# Patient Record
Sex: Female | Born: 1965 | Hispanic: Yes | Marital: Married | Smoking: Never smoker
Health system: Southern US, Community
[De-identification: ages and names within clinical notes are randomized; demographics above are authoritative.]

---

## 2016-11-18 ENCOUNTER — Ambulatory Visit: Payer: Self-pay

## 2016-11-18 ENCOUNTER — Ambulatory Visit
Admission: RE | Admit: 2016-11-18 | Discharge: 2016-11-18 | Disposition: A | Discharge status: Home / Self Care | Payer: Self-pay | Source: Ambulatory Visit | Attending: Oncology | Admitting: Oncology

## 2016-11-18 ENCOUNTER — Encounter (INDEPENDENT_AMBULATORY_CARE_PROVIDER_SITE_OTHER): Payer: Self-pay

## 2016-11-18 ENCOUNTER — Encounter: Payer: Self-pay | Admitting: *Deleted

## 2016-11-18 ENCOUNTER — Ambulatory Visit: Payer: Self-pay | Attending: Oncology | Admitting: *Deleted

## 2016-11-18 VITALS — BP 105/70 | HR 73 | Temp 92.7°F | Resp 18 | Ht 62.0 in | Wt 148.4 lb

## 2016-11-18 DIAGNOSIS — Z0000 Encounter for general adult medical examination without abnormal findings: Secondary | ICD-10-CM

## 2016-11-18 NOTE — Patient Instructions (Signed)
Gave patient hand-out, Women Staying Healthy, Active and Well from [facility], with education on breast health, pap smears, heart and colon health. 

## 2016-11-18 NOTE — Progress Notes (Signed)
Subjective:     Patient ID: Flavia ShipperAntonia Pantaleon Santos, female   DOB: February 13, 1966, 51 y.o.   MRN: [id number]  HPI   Review of Systems     Objective:   Physical Exam  Pulmonary/Chest: Right breast exhibits no inverted nipple, no mass, no nipple discharge, no skin change and no tenderness. Left breast exhibits no inverted nipple, no mass, no nipple discharge, no skin change and no tenderness. Breasts are symmetrical.       Assessment:     51 year old Hispanic [facility] and [facility].  I used the Language line interpreter [phone] for interpretation.  Clinical breast exam unremarkable.  Taught self breast awareness.  Patient's last pap was in 2015.  [facility].  Pap was negative, but HPV was not tested.  Patient is due for next pap, but she prefers to [facility] that [facility].  Informed her son how [facility].  Patient has been screened for eligibility.  She does not have any insurance, Medicare or Medicaid.  She also meets financial eligibility.  Hand-out given on the Affordable Care Act.    Plan:     Baseline screening mammogram ordered.  Will follow-up per [facility] protocol.

## 2016-11-19 ENCOUNTER — Encounter: Payer: Self-pay | Admitting: *Deleted

## 2016-11-19 DIAGNOSIS — N6489 Other specified disorders of breast: Secondary | ICD-10-CM

## 2016-11-27 ENCOUNTER — Ambulatory Visit
Admission: RE | Admit: 2016-11-27 | Discharge: 2016-11-27 | Disposition: A | Discharge status: Home / Self Care | Payer: Self-pay | Source: Ambulatory Visit | Attending: Oncology | Admitting: Oncology

## 2016-11-27 ENCOUNTER — Encounter: Payer: Self-pay | Admitting: *Deleted

## 2016-11-27 DIAGNOSIS — N6489 Other specified disorders of breast: Secondary | ICD-10-CM

## 2016-11-27 NOTE — Progress Notes (Signed)
Letter mailed to inform patient of her normal mammogram.  She is to return in one year for her annual screening.  HSIS to [facility]y. 

## 2016-12-04 ENCOUNTER — Encounter: Payer: Self-pay | Admitting: Family Medicine

## 2018-06-01 IMAGING — MG MM DIGITAL SCREENING BILAT W/ TOMO W/ CAD
9 of 13 series · 9 of 29 positions shown · non-contrast
Comparison: None.

CLINICAL DATA: Screening. Baseline exam.

EXAM:
2D DIGITAL SCREENING BILATERAL MAMMOGRAM WITH CAD AND ADJUNCT TOMO

[L CC]
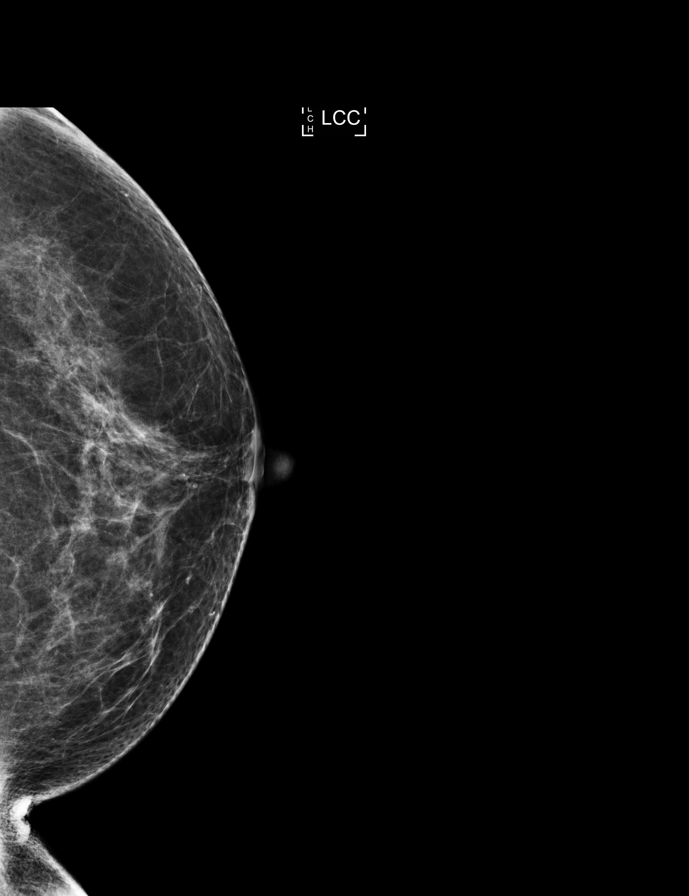

[L MLO synth-2D]
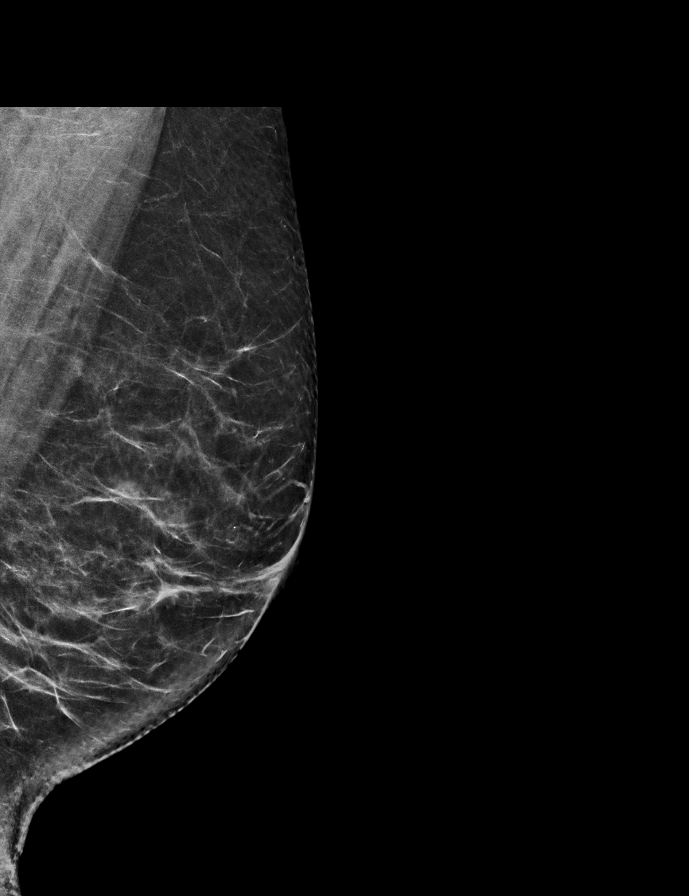

[R CC synth-2D]
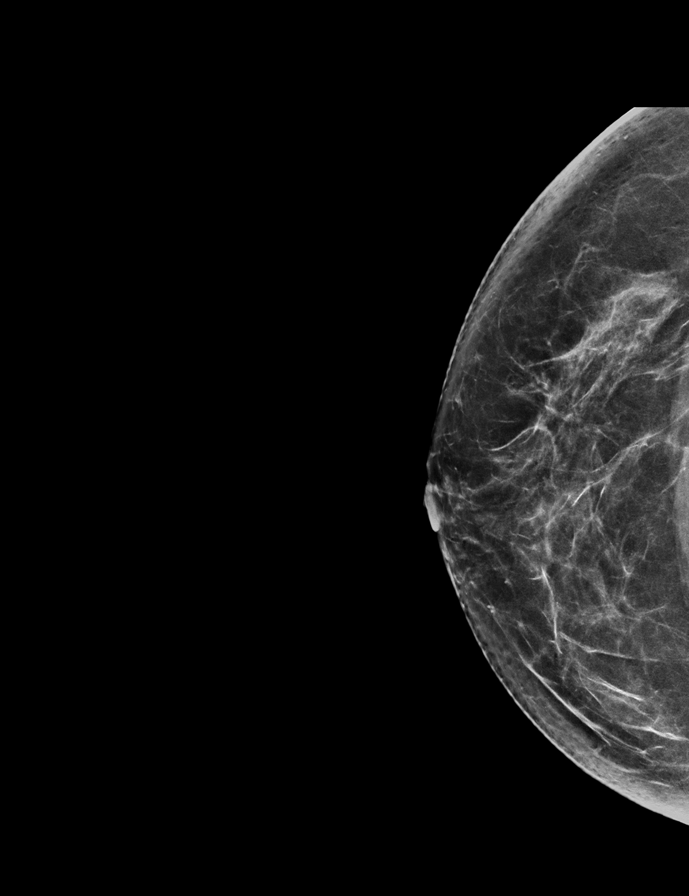

[R MLO]
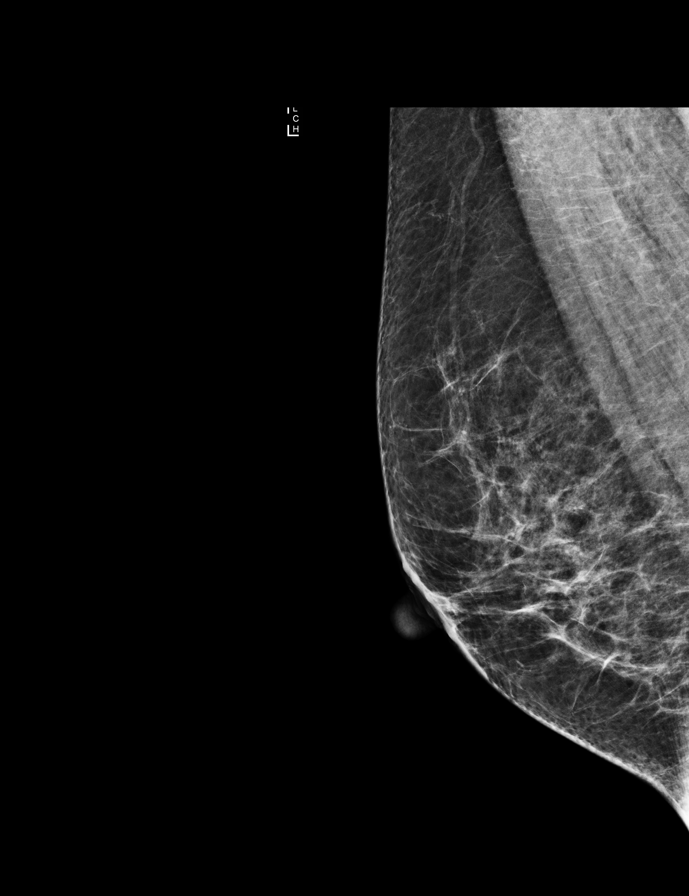

[R MLO synth-2D]
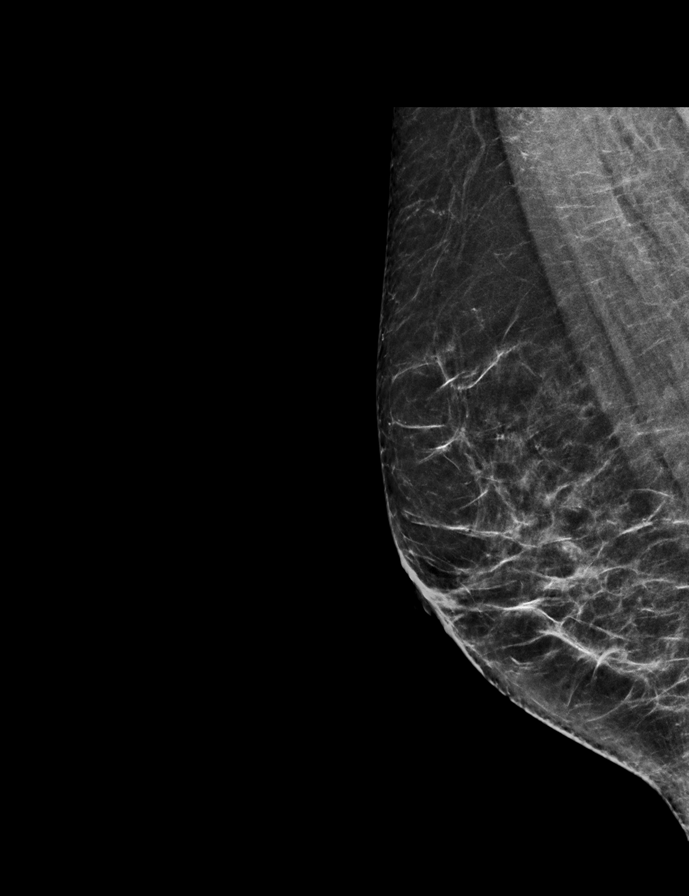

[L CC synth-2D]
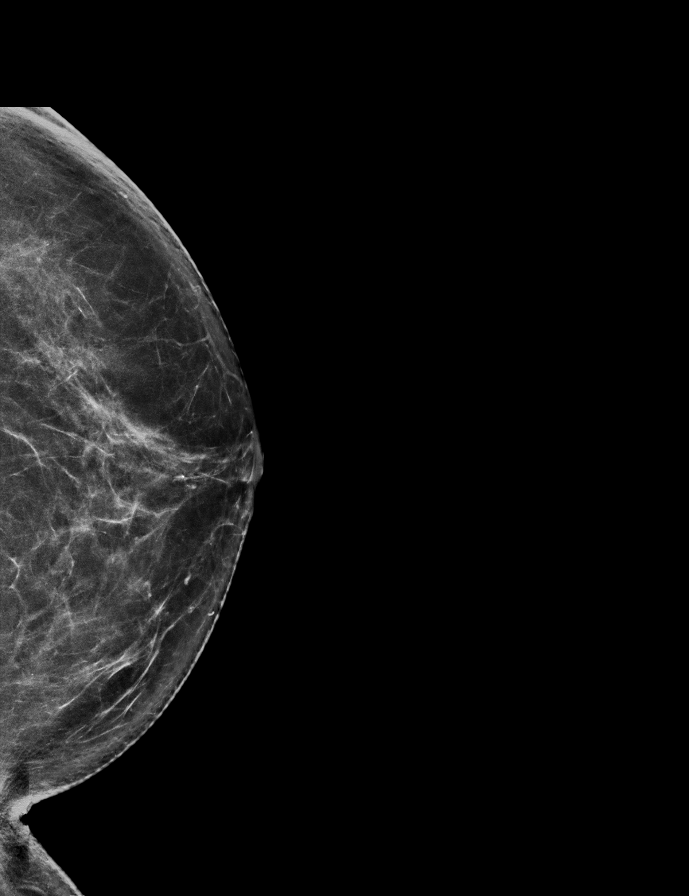

[L MLO (1 of 2)]
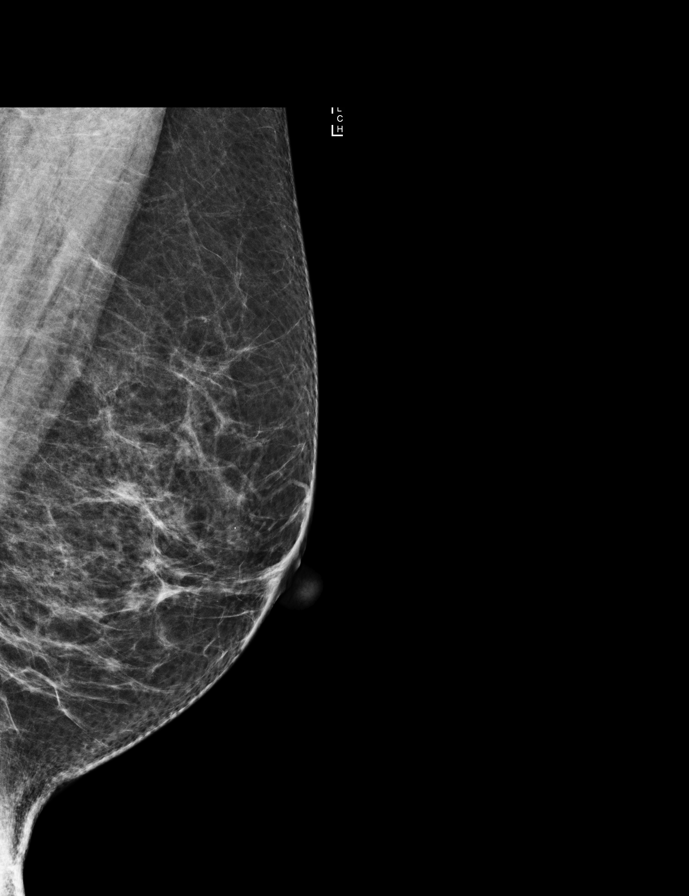

[R CC]
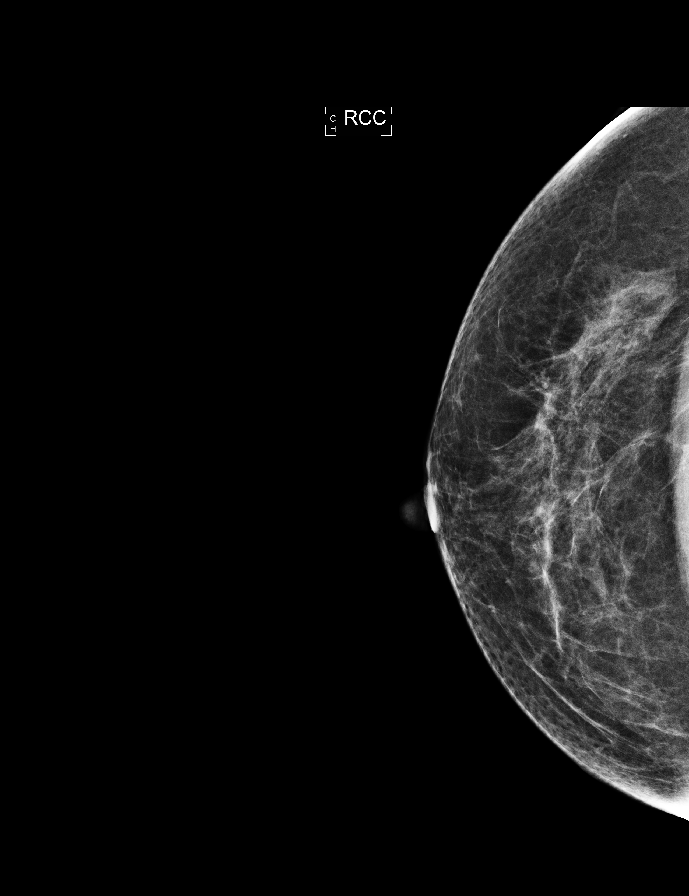

[L MLO (2 of 2)]
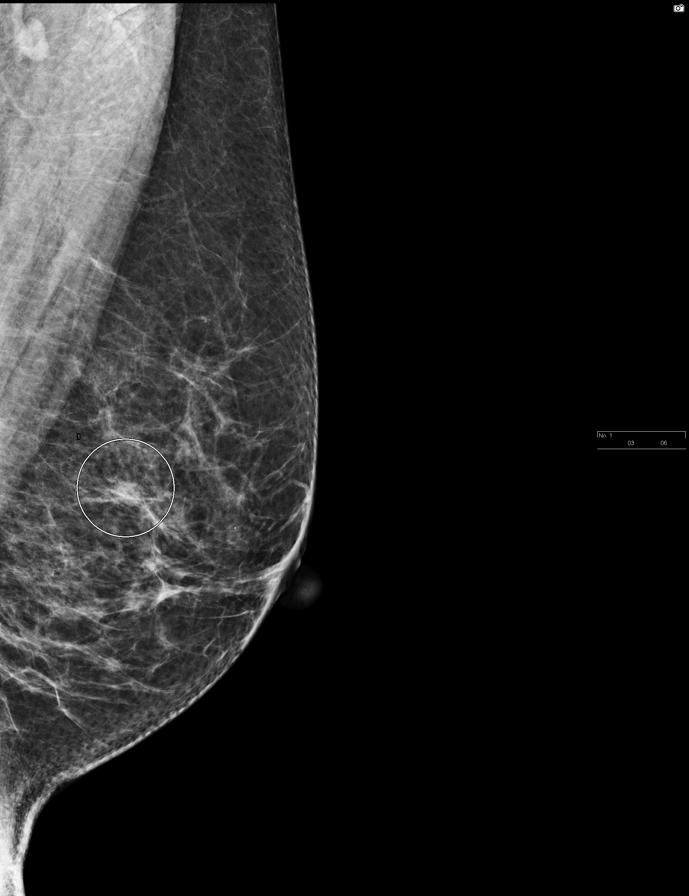

[9 of 29 positions shown; findings below may reference images not displayed]

ACR Breast Density Category b: There are scattered areas of
fibroglandular density.
FINDINGS: In the left breast, a possible asymmetry warrants further
evaluation. In the right breast, no findings suspicious for
malignancy. Images were processed with CAD.
IMPRESSION: Further evaluation is suggested for possible asymmetry in the left
breast.

RECOMMENDATION:
Diagnostic mammogram and possibly ultrasound of the left breast.
(Code:[id number])

The patient will be contacted regarding the findings, and additional
imaging will be scheduled.

BI-RADS CATEGORY  0: Incomplete. Need additional imaging evaluation
and/or prior mammograms for comparison.

## 2022-01-07 ENCOUNTER — Other Ambulatory Visit: Payer: Self-pay

## 2022-01-07 DIAGNOSIS — Z1211 Encounter for screening for malignant neoplasm of colon: Secondary | ICD-10-CM

## 2022-01-07 DIAGNOSIS — Z1231 Encounter for screening mammogram for malignant neoplasm of breast: Secondary | ICD-10-CM

## 2022-01-13 ENCOUNTER — Ambulatory Visit
Admission: RE | Admit: 2022-01-13 | Discharge: 2022-01-13 | Disposition: A | Discharge status: Home / Self Care | Payer: Self-pay | Source: Ambulatory Visit | Attending: Obstetrics and Gynecology | Admitting: Obstetrics and Gynecology

## 2022-01-13 ENCOUNTER — Ambulatory Visit: Payer: Self-pay | Attending: Hematology and Oncology | Admitting: *Deleted

## 2022-01-13 VITALS — BP 107/73 | HR 66 | Resp 18 | Wt 160.0 lb

## 2022-01-13 DIAGNOSIS — Z1231 Encounter for screening mammogram for malignant neoplasm of breast: Secondary | ICD-10-CM | POA: Insufficient documentation

## 2022-01-13 DIAGNOSIS — Z01419 Encounter for gynecological examination (general) (routine) without abnormal findings: Secondary | ICD-10-CM

## 2022-01-13 NOTE — Progress Notes (Signed)
Ms. Faith Schroeder is a 56 y.o. female who presents to [facility] clinic today with her husband. She has no complaints. She is here today for her well woman visit.    Pap Smear: Pap not smear completed today. Last Pap smear was 2 years ago per patient at [facility] clinic and was normal. Per patient has no history of an abnormal Pap smear. Last Pap smear result is not available in Epic.  Encouraged patient to follow up with her PCP to discuss when her next pap smear will be due.  Informed her they are usually due every 3-5 years based on the results.   Physical exam: Breasts Breasts symmetrical. No skin abnormalities bilateral breasts. No nipple retraction bilateral breasts. No nipple discharge bilateral breasts. No lymphadenopathy. No lumps palpated bilateral breasts.       Pelvic/Bimanual Pap is not indicated today    Smoking History: Patient has never smoked.    Patient Navigation: Patient education provided. Access to services provided for patient through [facility] program. Claretha Cooper the interpreter provided for interpretation. No transportation provided   Colorectal Cancer Screening: Per patient she has not had a colonoscopy.  No complaints today. FIT tested given to patient with instructions.   Breast and Cervical Cancer Risk Assessment: Patient does not have family history of breast cancer, known genetic mutations, or radiation treatment to the chest before age 68. Patient does not have history of cervical dysplasia, immunocompromised, or DES exposure in-utero.  Risk Assessment     Risk Scores       01/13/2022   Last edited by: Lesle Chris, RN   5-year risk: 0.8 %   Lifetime risk: 5.8 %            A: [facility] exam without pap smear  P: Referred patient to the [facility] for a screening mammogram. Appointment scheduled for today.  Jim Like, RN 01/13/2022 9:48 AM

## 2024-06-30 ENCOUNTER — Encounter: Payer: Self-pay | Admitting: Nurse Practitioner

## 2024-07-17 ENCOUNTER — Telehealth: Payer: Self-pay
